# Patient Record
Sex: Female | Born: 2005 | Race: Black or African American | Hispanic: No | Marital: Single | Smoking: Never smoker
Health system: Southern US, Community
[De-identification: ages and names within clinical notes are randomized; demographics above are authoritative.]

## PROBLEM LIST (undated history)

## (undated) DIAGNOSIS — J45909 Unspecified asthma, uncomplicated: Secondary | ICD-10-CM

---
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
## 2024-03-19 ENCOUNTER — Encounter: Payer: Self-pay | Admitting: Family

## 2024-03-20 ENCOUNTER — Encounter: Payer: Self-pay | Admitting: Family

## 2024-03-20 ENCOUNTER — Ambulatory Visit (INDEPENDENT_AMBULATORY_CARE_PROVIDER_SITE_OTHER): Admitting: Family

## 2024-03-20 ENCOUNTER — Other Ambulatory Visit: Payer: Self-pay

## 2024-03-20 ENCOUNTER — Other Ambulatory Visit (HOSPITAL_COMMUNITY)
Admission: RE | Admit: 2024-03-20 | Discharge: 2024-03-20 | Disposition: A | Discharge status: Home / Self Care | Source: Ambulatory Visit | Attending: Family | Admitting: Family

## 2024-03-20 VITALS — BP 117/75 | HR 75 | Ht 64.57 in | Wt 164.8 lb

## 2024-03-20 DIAGNOSIS — Z3202 Encounter for pregnancy test, result negative: Secondary | ICD-10-CM

## 2024-03-20 DIAGNOSIS — R350 Frequency of micturition: Secondary | ICD-10-CM | POA: Insufficient documentation

## 2024-03-20 DIAGNOSIS — K5901 Slow transit constipation: Secondary | ICD-10-CM

## 2024-03-20 LAB — URINE CYTOLOGY ANCILLARY ONLY
Chlamydia: NEGATIVE
Comment: NEGATIVE
Comment: NORMAL
Neisseria Gonorrhea: NEGATIVE

## 2024-03-20 LAB — POCT URINALYSIS DIPSTICK
Bilirubin, UA: NEGATIVE
Blood, UA: NEGATIVE
Glucose, UA: NEGATIVE
Ketones, UA: NEGATIVE
Nitrite, UA: NEGATIVE
Protein, UA: NEGATIVE
Spec Grav, UA: 1.015 (ref 1.010–1.025)
Urobilinogen, UA: NEGATIVE U/dL — AB
pH, UA: 7 (ref 5.0–8.0)

## 2024-03-20 LAB — POCT URINE PREGNANCY: Preg Test, Ur: NEGATIVE

## 2024-03-20 MED ORDER — POLYETHYLENE GLYCOL 3350 17 GM/SCOOP PO POWD
ORAL | 6 refills | Status: AC
  Filled 2024-03-20: qty 510, 30d supply, fill #0

## 2024-03-20 MED ORDER — SENNOSIDES-DOCUSATE SODIUM 8.6-50 MG PO TABS
1.0000 | ORAL_TABLET | Freq: Every day | ORAL | 0 refills | Status: AC
  Filled 2024-03-20: qty 30, 30d supply, fill #0

## 2024-03-20 NOTE — Progress Notes (Unsigned)
 History was provided by the patient.   PCP confirmed? Yes.    Georgiann Hahn, MD  SUBJECTIVE: Rita Barnett is a 18 y.o. female who complains of urinary frequency, urgency x  2 days, without flank pain, fever, chills, or abnormal vaginal discharge or bleeding. No pain with intercourse.   Pelvic pain and back pain for about 2 days.  LMP uses app 03/05 Some issues with constipation  No pain with intercourse  BM last 2 days ago; normally goes every day  Tried laxative but didn't really do anything  Not a full bowel movement      Patient Active Problem List   Diagnosis Date Noted   Abscess of axilla, right 09/01/2023    Current Outpatient Medications on File Prior to Visit  Medication Sig Dispense Refill   Clindamycin-Benzoyl Per, Refr, gel Apply 1 application  topically daily. (Patient not taking: Reported on 03/20/2024) 45 g 5   metroNIDAZOLE (FLAGYL) 500 MG tablet Take 1 tablet (500 mg total) by mouth 2 (two) times daily. (Patient not taking: Reported on 03/20/2024) 14 tablet 0   mupirocin ointment (BACTROBAN) 2 % Apply twice daily (Patient not taking: Reported on 03/20/2024) 22 g 3   norgestrel-ethinyl estradiol (LO/OVRAL) 0.3-30 MG-MCG tablet Take 1 tablet by mouth daily. (Patient not taking: Reported on 03/20/2024) 84 tablet 3   No current facility-administered medications on file prior to visit.    Allergies  Allergen Reactions   Penicillin G Rash   Penicillins Rash    Physical Exam:    Vitals:   03/20/24 1018  BP: 117/75  Pulse: 75  Weight: 164 lb 12.8 oz (74.8 kg)  Height: 5' 4.57" (1.64 m)   Wt Readings from Last 3 Encounters:  03/20/24 164 lb 12.8 oz (74.8 kg) (90%, Z= 1.30)*  08/31/23 159 lb 11.2 oz (72.4 kg) (89%, Z= 1.22)*  07/20/23 165 lb (74.8 kg) (91%, Z= 1.35)*   * Growth percentiles are based on CDC (Girls, 2-20 Years) data.     Blood pressure %iles are not available for patients who are 18 years or older. No LMP recorded.   OBJECTIVE:  Appears well, in no apparent distress.  Vital signs are normal. The abdomen is soft without tenderness, guarding, mass, rebound or organomegaly. No CVA tenderness. Urine dipstick shows negative for all components.  UPT is negative.   Physical Exam Constitutional:      General: She is not in acute distress.    Appearance: She is well-developed.  HENT:     Head: Normocephalic and atraumatic.  Eyes:     General: No scleral icterus.    Pupils: Pupils are equal, round, and reactive to light.  Neck:     Thyroid: No thyromegaly.  Cardiovascular:     Rate and Rhythm: Normal rate and regular rhythm.     Heart sounds: Normal heart sounds. No murmur heard. Pulmonary:     Effort: Pulmonary effort is normal.     Breath sounds: Normal breath sounds.  Abdominal:     General: Bowel sounds are normal.     Palpations: Abdomen is soft.     Tenderness: There is no right CVA tenderness, left CVA tenderness, guarding or rebound.  Musculoskeletal:        General: Normal range of motion.     Cervical back: Normal range of motion and neck supple.  Lymphadenopathy:     Cervical: No cervical adenopathy.  Skin:    General: Skin is warm and dry.     Findings:  No rash.  Neurological:     Mental Status: She is alert and oriented to person, place, and time.     Cranial Nerves: No cranial nerve deficit.  Psychiatric:        Behavior: Behavior normal.        Thought Content: Thought content normal.        Judgment: Judgment normal.      Assessment/Plan:  1. Urine frequency (Primary) 2. Slow transit constipation - POCT urine pregnancy - POCT urinalysis dipstick - Urine cytology ancillary only - WET PREP BY MOLECULAR PROBE  ASSESSMENT: constipation, urinary frequency with no sign of UTI. Will r/o STI or vaginal infections.  PLAN: Treat underlying constipation; reviewed constipation clean-out instructions and stool softener use; increase fluid intake; will await results for additional  testing/infections. Return precautions reviewed.

## 2024-03-21 ENCOUNTER — Encounter: Payer: Self-pay | Admitting: Family

## 2024-03-21 ENCOUNTER — Other Ambulatory Visit: Payer: Self-pay | Admitting: Family

## 2024-03-21 ENCOUNTER — Other Ambulatory Visit: Payer: Self-pay

## 2024-03-21 DIAGNOSIS — B9689 Other specified bacterial agents as the cause of diseases classified elsewhere: Secondary | ICD-10-CM

## 2024-03-21 DIAGNOSIS — B3731 Acute candidiasis of vulva and vagina: Secondary | ICD-10-CM

## 2024-03-21 LAB — WET PREP BY MOLECULAR PROBE
Candida species: DETECTED — AB
MICRO NUMBER:: 16255787
SPECIMEN QUALITY:: ADEQUATE
Trichomonas vaginosis: NOT DETECTED

## 2024-03-21 MED ORDER — FLUCONAZOLE 150 MG PO TABS
ORAL_TABLET | ORAL | 0 refills | Status: AC
  Filled 2024-03-21: qty 2, 4d supply, fill #0

## 2024-03-21 MED ORDER — METRONIDAZOLE 500 MG PO TABS
500.0000 mg | ORAL_TABLET | Freq: Two times a day (BID) | ORAL | 0 refills | Status: AC
  Filled 2024-03-21: qty 14, 7d supply, fill #0

## 2024-10-22 ENCOUNTER — Other Ambulatory Visit: Payer: Self-pay | Admitting: Family

## 2024-10-22 DIAGNOSIS — L700 Acne vulgaris: Secondary | ICD-10-CM

## 2024-10-22 MED ORDER — CLINDAMYCIN PHOS-BENZOYL PEROX 1.2-5 % EX GEL: 1.0000 | Freq: Every day | CUTANEOUS | 5 refills | Status: AC

## 2024-12-22 ENCOUNTER — Other Ambulatory Visit: Payer: Self-pay | Admitting: Family

## 2024-12-22 MED ORDER — NORGESTREL-ETHINYL ESTRADIOL 0.3-30 MG-MCG PO TABS: 1.0000 | ORAL_TABLET | Freq: Every day | ORAL | 3 refills | Status: AC

## 2024-12-23 ENCOUNTER — Encounter: Payer: Self-pay | Admitting: Family
